# Patient Record
Sex: Female | Born: 1995 | Race: White | Hispanic: No | Marital: Single | Smoking: Never smoker
Health system: Southern US, Community
[De-identification: ages and names within clinical notes are randomized; demographics above are authoritative.]

---
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
## 2018-12-07 ENCOUNTER — Other Ambulatory Visit (HOSPITAL_COMMUNITY)
Admission: RE | Admit: 2018-12-07 | Discharge: 2018-12-07 | Disposition: A | Discharge status: Home / Self Care | Payer: 59 | Source: Ambulatory Visit | Attending: Nurse Practitioner | Admitting: Nurse Practitioner

## 2018-12-07 DIAGNOSIS — Z124 Encounter for screening for malignant neoplasm of cervix: Secondary | ICD-10-CM | POA: Insufficient documentation

## 2023-12-15 ENCOUNTER — Other Ambulatory Visit: Payer: Self-pay
